# Patient Record
Sex: Male | Born: 1940 | Race: White | Hispanic: No | Marital: Married | Smoking: Current every day smoker
Health system: Southern US, Community
[De-identification: ages and names within clinical notes are randomized; demographics above are authoritative.]

## PROBLEM LIST (undated history)

## (undated) DIAGNOSIS — I739 Peripheral vascular disease, unspecified: Secondary | ICD-10-CM

## (undated) DIAGNOSIS — I10 Essential (primary) hypertension: Secondary | ICD-10-CM

## (undated) HISTORY — PX: CAROTID ENDARTERECTOMY: SUR193

## (undated) HISTORY — PX: FRACTURE SURGERY: SHX138

---

## 2009-07-08 ENCOUNTER — Emergency Department (HOSPITAL_COMMUNITY): Admission: EM | Admit: 2009-07-08 | Discharge: 2009-07-08 | Payer: Self-pay | Admitting: Emergency Medicine
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
## 2010-09-14 LAB — BASIC METABOLIC PANEL
BUN: 11 mg/dL (ref 6–23)
Calcium: 9 mg/dL (ref 8.4–10.5)
Chloride: 105 mEq/L (ref 96–112)
Creatinine, Ser: 0.91 mg/dL (ref 0.4–1.5)
GFR calc Af Amer: >60 mL/min (ref >60)
GFR calc non Af Amer: >60 mL/min (ref >60)
Glucose, Bld: 106 mg/dL — ABNORMAL HIGH (ref 70–99)
Potassium: 4.5 mEq/L (ref 3.5–5.1)

## 2010-09-14 LAB — CBC
Platelets: 168 10*3/uL (ref 150–400)
WBC: 15.3 10*3/uL — ABNORMAL HIGH (ref 4.0–10.5)

## 2011-07-10 ENCOUNTER — Other Ambulatory Visit: Payer: Self-pay | Admitting: Orthopedic Surgery

## 2011-07-13 ENCOUNTER — Encounter (HOSPITAL_BASED_OUTPATIENT_CLINIC_OR_DEPARTMENT_OTHER): Payer: Self-pay | Admitting: *Deleted

## 2011-07-13 NOTE — Progress Notes (Signed)
To come in for ekg,bmet  

## 2011-07-14 ENCOUNTER — Other Ambulatory Visit: Payer: Self-pay

## 2011-07-14 ENCOUNTER — Encounter (HOSPITAL_BASED_OUTPATIENT_CLINIC_OR_DEPARTMENT_OTHER)
Admission: RE | Admit: 2011-07-14 | Discharge: 2011-07-14 | Disposition: A | Discharge status: Home / Self Care | Payer: Medicare Other | Source: Ambulatory Visit | Attending: Orthopedic Surgery | Admitting: Orthopedic Surgery

## 2011-07-14 LAB — BASIC METABOLIC PANEL
BUN: 15 mg/dL (ref 6–23)
CO2: 28 mEq/L (ref 19–32)
Chloride: 101 mEq/L (ref 96–112)
GFR calc Af Amer: >90 mL/min (ref >90)
Glucose, Bld: 90 mg/dL (ref 70–99)
Potassium: 4.6 mEq/L (ref 3.5–5.1)

## 2011-07-15 ENCOUNTER — Ambulatory Visit (HOSPITAL_BASED_OUTPATIENT_CLINIC_OR_DEPARTMENT_OTHER): Payer: Medicare Other | Admitting: Anesthesiology

## 2011-07-15 ENCOUNTER — Encounter (HOSPITAL_BASED_OUTPATIENT_CLINIC_OR_DEPARTMENT_OTHER): Payer: Self-pay | Admitting: Anesthesiology

## 2011-07-15 ENCOUNTER — Ambulatory Visit (HOSPITAL_BASED_OUTPATIENT_CLINIC_OR_DEPARTMENT_OTHER)
Admission: RE | Admit: 2011-07-15 | Discharge: 2011-07-15 | Disposition: A | Discharge status: Home / Self Care | Payer: Medicare Other | Source: Ambulatory Visit | Attending: Orthopedic Surgery | Admitting: Orthopedic Surgery

## 2011-07-15 ENCOUNTER — Encounter (HOSPITAL_BASED_OUTPATIENT_CLINIC_OR_DEPARTMENT_OTHER)
Admission: RE | Disposition: A | Discharge status: Home / Self Care | Payer: Self-pay | Source: Ambulatory Visit | Attending: Orthopedic Surgery

## 2011-07-15 ENCOUNTER — Encounter (HOSPITAL_BASED_OUTPATIENT_CLINIC_OR_DEPARTMENT_OTHER): Payer: Self-pay | Admitting: Orthopedic Surgery

## 2011-07-15 DIAGNOSIS — Z01810 Encounter for preprocedural cardiovascular examination: Secondary | ICD-10-CM | POA: Insufficient documentation

## 2011-07-15 DIAGNOSIS — Z01812 Encounter for preprocedural laboratory examination: Secondary | ICD-10-CM | POA: Insufficient documentation

## 2011-07-15 DIAGNOSIS — IMO0002 Reserved for concepts with insufficient information to code with codable children: Secondary | ICD-10-CM | POA: Insufficient documentation

## 2011-07-15 DIAGNOSIS — I10 Essential (primary) hypertension: Secondary | ICD-10-CM | POA: Insufficient documentation

## 2011-07-15 DIAGNOSIS — X58XXXS Exposure to other specified factors, sequela: Secondary | ICD-10-CM | POA: Insufficient documentation

## 2011-07-15 HISTORY — DX: Essential (primary) hypertension: I10

## 2011-07-15 HISTORY — DX: Peripheral vascular disease, unspecified: I73.9

## 2011-07-15 HISTORY — PX: AMPUTATION: SHX166

## 2011-07-15 LAB — ANAEROBIC CULTURE: Gram Stain: NONE SEEN

## 2011-07-15 LAB — WOUND CULTURE

## 2011-07-15 SURGERY — AMPUTATION DIGIT
Anesthesia: General | Site: Finger | Laterality: Left | Wound class: Dirty or Infected

## 2011-07-15 MED ORDER — METOCLOPRAMIDE HCL 5 MG/ML IJ SOLN: 10.0000 mg | Freq: Once | INTRAMUSCULAR | Status: DC | PRN

## 2011-07-15 MED ORDER — CEFAZOLIN SODIUM 1-5 GM-% IV SOLN: 1.0000 g | INTRAVENOUS | Status: DC

## 2011-07-15 MED ORDER — DEXAMETHASONE SODIUM PHOSPHATE 10 MG/ML IJ SOLN
INTRAMUSCULAR | Status: DC | PRN
  Administered 2011-07-15: 10 mg via INTRAVENOUS

## 2011-07-15 MED ORDER — CEPHALEXIN 250 MG PO CAPS: 250.0000 mg | ORAL_CAPSULE | Freq: Four times a day (QID) | ORAL | Status: AC

## 2011-07-15 MED ORDER — MIDAZOLAM HCL 2 MG/2ML IJ SOLN
0.5000 mg | INTRAMUSCULAR | Status: DC | PRN
  Administered 2011-07-15: 2 mg via INTRAVENOUS

## 2011-07-15 MED ORDER — LIDOCAINE HCL (CARDIAC) 20 MG/ML IV SOLN
INTRAVENOUS | Status: DC | PRN
  Administered 2011-07-15: 40 mg via INTRAVENOUS

## 2011-07-15 MED ORDER — CHLORHEXIDINE GLUCONATE 4 % EX LIQD: 60.0000 mL | Freq: Once | CUTANEOUS | Status: DC

## 2011-07-15 MED ORDER — CEFAZOLIN SODIUM 1-5 GM-% IV SOLN
1.0000 g | INTRAVENOUS | Status: AC
  Administered 2011-07-15: 1 g via INTRAVENOUS

## 2011-07-15 MED ORDER — LACTATED RINGERS IV SOLN
INTRAVENOUS | Status: DC
  Administered 2011-07-15 (×2): via INTRAVENOUS

## 2011-07-15 MED ORDER — HYDROCODONE-ACETAMINOPHEN 5-500 MG PO TABS: 1.0000 | ORAL_TABLET | ORAL | Status: AC | PRN

## 2011-07-15 MED ORDER — 0.9 % SODIUM CHLORIDE (POUR BTL) OPTIME
TOPICAL | Status: DC | PRN
  Administered 2011-07-15: 200 mL

## 2011-07-15 MED ORDER — LIDOCAINE HCL 1 % IJ SOLN
INTRAMUSCULAR | Status: DC | PRN
  Administered 2011-07-15: 2 mL via INTRADERMAL

## 2011-07-15 MED ORDER — EPHEDRINE SULFATE 50 MG/ML IJ SOLN
INTRAMUSCULAR | Status: DC | PRN
  Administered 2011-07-15: 10 mg via INTRAVENOUS

## 2011-07-15 MED ORDER — PROPOFOL 10 MG/ML IV EMUL
INTRAVENOUS | Status: DC | PRN
  Administered 2011-07-15: 200 mg via INTRAVENOUS

## 2011-07-15 MED ORDER — ROPIVACAINE HCL 5 MG/ML IJ SOLN
INTRAMUSCULAR | Status: DC | PRN
  Administered 2011-07-15: 30 mL via EPIDURAL

## 2011-07-15 MED ORDER — ONDANSETRON HCL 4 MG/2ML IJ SOLN
INTRAMUSCULAR | Status: DC | PRN
  Administered 2011-07-15: 4 mg via INTRAVENOUS

## 2011-07-15 MED ORDER — FENTANYL CITRATE 0.05 MG/ML IJ SOLN: 25.0000 ug | INTRAMUSCULAR | Status: DC | PRN

## 2011-07-15 MED ORDER — FENTANYL CITRATE 0.05 MG/ML IJ SOLN
50.0000 ug | INTRAMUSCULAR | Status: DC | PRN
  Administered 2011-07-15: 50 ug via INTRAVENOUS

## 2011-07-15 MED ORDER — MORPHINE SULFATE 2 MG/ML IJ SOLN: 0.0500 mg/kg | INTRAMUSCULAR | Status: DC | PRN

## 2011-07-15 SURGICAL SUPPLY — 48 items
BLADE MINI RND TIP GREEN BEAV (BLADE) IMPLANT
BLADE OSC/SAG .038X5.5 CUT EDG (BLADE) IMPLANT
BLADE SURG 15 STRL LF DISP TIS (BLADE) ×1 IMPLANT
BLADE SURG 15 STRL SS (BLADE) ×1
BNDG COHESIVE 1X5 TAN STRL LF (GAUZE/BANDAGES/DRESSINGS) ×2 IMPLANT
BNDG ESMARK 4X9 LF (GAUZE/BANDAGES/DRESSINGS) IMPLANT
CHLORAPREP W/TINT 26ML (MISCELLANEOUS) ×2 IMPLANT
CLOTH BEACON ORANGE TIMEOUT ST (SAFETY) ×2 IMPLANT
CORDS BIPOLAR (ELECTRODE) ×2 IMPLANT
COVER MAYO STAND STRL (DRAPES) ×2 IMPLANT
COVER TABLE BACK 60X90 (DRAPES) ×2 IMPLANT
CUFF TOURNIQUET SINGLE 18IN (TOURNIQUET CUFF) IMPLANT
DECANTER SPIKE VIAL GLASS SM (MISCELLANEOUS) IMPLANT
DRAIN PENROSE 1/4X12 LTX STRL (WOUND CARE) IMPLANT
DRAPE EXTREMITY T 121X128X90 (DRAPE) ×2 IMPLANT
DRAPE OEC MINIVIEW 54X84 (DRAPES) IMPLANT
DRAPE SURG 17X23 STRL (DRAPES) ×2 IMPLANT
DRSG XEROFORM 1X8 (GAUZE/BANDAGES/DRESSINGS) ×2 IMPLANT
GAUZE XEROFORM 1X8 LF (GAUZE/BANDAGES/DRESSINGS) ×2 IMPLANT
GLOVE BIO SURGEON STRL SZ 6.5 (GLOVE) ×2 IMPLANT
GLOVE BIOGEL PI IND STRL 8.5 (GLOVE) ×1 IMPLANT
GLOVE BIOGEL PI INDICATOR 8.5 (GLOVE) ×1
GLOVE SURG ORTHO 8.0 STRL STRW (GLOVE) ×2 IMPLANT
GOWN BRE IMP PREV XXLGXLNG (GOWN DISPOSABLE) ×2 IMPLANT
GOWN PREVENTION PLUS XLARGE (GOWN DISPOSABLE) ×2 IMPLANT
NS IRRIG 1000ML POUR BTL (IV SOLUTION) ×2 IMPLANT
PACK BASIN DAY SURGERY FS (CUSTOM PROCEDURE TRAY) ×2 IMPLANT
PADDING CAST ABS 4INX4YD NS (CAST SUPPLIES) ×1
PADDING CAST ABS COTTON 4X4 ST (CAST SUPPLIES) ×1 IMPLANT
SPLINT FINGER 5/8X3.25 (SOFTGOODS) ×1 IMPLANT
SPLINT FINGER FOAM 3 9119 05 (SOFTGOODS) ×2
SPLINT FNGR BALL END 5/8X4.25 (SOFTGOODS) IMPLANT
SPLINT PLASTALUME BALL 4 1/4IN (SOFTGOODS)
SPONGE GAUZE 4X4 12PLY (GAUZE/BANDAGES/DRESSINGS) ×2 IMPLANT
STOCKINETTE 4X48 STRL (DRAPES) ×2 IMPLANT
SUT CHROMIC 4 0 P 3 18 (SUTURE) IMPLANT
SUT MERSILENE 4 0 P 3 (SUTURE) IMPLANT
SUT VIC AB 4-0 P-3 18XBRD (SUTURE) IMPLANT
SUT VIC AB 4-0 P3 18 (SUTURE)
SUT VICRYL RAPID 5 0 P 3 (SUTURE) IMPLANT
SUT VICRYL RAPIDE 4/0 PS 2 (SUTURE) ×2 IMPLANT
SWAB CULTURE LIQ STUART DBL (MISCELLANEOUS) ×2 IMPLANT
SYR BULB 3OZ (MISCELLANEOUS) ×2 IMPLANT
SYR CONTROL 10ML LL (SYRINGE) IMPLANT
TOWEL OR 17X24 6PK STRL BLUE (TOWEL DISPOSABLE) ×2 IMPLANT
TUBE ANAEROBIC SPECIMEN COL (MISCELLANEOUS) ×2 IMPLANT
UNDERPAD 30X30 INCONTINENT (UNDERPADS AND DIAPERS) ×2 IMPLANT
WATER STERILE IRR 1000ML POUR (IV SOLUTION) ×2 IMPLANT

## 2011-07-15 NOTE — H&P (Signed)
Hector Mcdowell is a 71 year old right hand dominant male who comes in complaining of having smashed his left ring finger in a wood splitter on 05-24-11. He did not seek medical attention at the time. He has been treating this himself. He simply cut off the part that was dangling. He is complaining of pain whenever he touches the tip of the finger. He has had no treatment otherwise.  Past Medical History: He has no allergies. He is on Lisinopril and Atenolol. He has had stents in his neck.  Family Medical History: Negative.  Social History: He smokes one half PPD and is advised to quit and the reasons for this. He does not drink.  Review of Systems: Positive for high BP, otherwise negative for 14 points 07/15/2011  Hector Mcdowell is an 71 y.o. male.   Chief Complaint: partial amputation LRF HPI: see above  Past Medical History  Diagnosis Date  . Hypertension   . PVD (peripheral vascular disease)     Past Surgical History  Procedure Date  . Carotid endarterectomy     both sides  . Fracture surgery     fx jaw as a young man-no surg    History reviewed. No pertinent family history. Social History:  reports that he has been smoking.  He does not have any smokeless tobacco history on file. He reports that he drinks alcohol. He reports that he does not use illicit drugs.  Allergies: No Known Allergies  No current facility-administered medications on file as of 07/15/2011.   Medications Prior to Admission  Medication Sig Dispense Refill  . atenolol (TENORMIN) 25 MG tablet Take 25 mg by mouth daily.      Marland Kitchen lisinopril (PRINIVIL,ZESTRIL) 20 MG tablet Take 20 mg by mouth daily.        Results for orders placed during the hospital encounter of 07/15/11 (from the past 48 hour(s))  BASIC METABOLIC PANEL     Status: Abnormal   Collection Time   07/14/11 11:30 AM      Component Value Range Comment   Sodium 138  135 - 145 (mEq/L)    Potassium 4.6  3.5 - 5.1 (mEq/L)    Chloride 101  96 -  112 (mEq/L)    CO2 28  19 - 32 (mEq/L)    Glucose, Bld 90  70 - 99 (mg/dL)    BUN 15  6 - 23 (mg/dL)    Creatinine, Ser 1.61  0.50 - 1.35 (mg/dL)    Calcium 9.5  8.4 - 10.5 (mg/dL)    GFR calc non Af Amer 81 (*) >90 (mL/min)    GFR calc Af Amer >90  >90 (mL/min)     No results found.   Pertinent items are noted in HPI.  Height 5\' 11"  (1.803 m), weight 72.576 kg (160 lb).  General appearance: alert, cooperative and appears stated age Head: Normocephalic, without obvious abnormality Neck: no adenopathy Resp: clear to auscultation bilaterally Cardio: regular rate and rhythm, S1, S2 normal, no murmur, click, rub or gallop GI: soft, non-tender; bowel sounds normal; no masses,  no organomegaly Extremities: extremities normal, atraumatic, no cyanosis or edema Pulses: 2+ and symmetric Skin: Skin color, texture, turgor normal. No rashes or lesions Neurologic: Grossly normal Incision/Wound: Granulating   Assessment/Plan We would recommend revision of this. He is advised this may be at the level of his DIP joint.  The pre, peri and post op course are discussed along with risks and complications. They are in agreement with this.  Hector Mcdowell R 07/15/2011, 10:32 AM                Hector Mcdowell is a 71 y.o. male patient.  No diagnosis found. Past Medical History  Diagnosis Date  . Hypertension   . PVD (peripheral vascular disease)    No past surgical history pertinent negatives on file. Scheduled Meds:   Continuous Infusions:   PRN Meds:    No Known Allergies Active Problems:  * No active hospital problems. *   Height 5\' 11"  (1.803 m), weight 72.576 kg (160 lb).  @IPPOSUB @ @IPPOOBJ @ @IPPOAP @  Hector Mcdowell R 07/15/2011

## 2011-07-15 NOTE — Transfer of Care (Signed)
Immediate Anesthesia Transfer of Care Note  Patient: Hector Mcdowell  Procedure(s) Performed:  AMPUTATION DIGIT - revision amputation left ring finger  Patient Location: PACU  Anesthesia Type: General  Level of Consciousness: sedated  Airway & Oxygen Therapy: Patient Spontanous Breathing and Patient connected to face mask oxygen  Post-op Assessment: Report given to PACU RN and Post -op Vital signs reviewed and stable  Post vital signs: Reviewed and stable Filed Vitals:   07/15/11 1200  BP: 151/68  Pulse: 61  Temp:   Resp: 19    Complications: No apparent anesthesia complications

## 2011-07-15 NOTE — Anesthesia Postprocedure Evaluation (Signed)
Anesthesia Post Note  Patient: Hector Mcdowell  Procedure(s) Performed:  AMPUTATION DIGIT - revision amputation left ring finger  Anesthesia type: General  Patient location: PACU  Post pain: Pain level controlled  Post assessment: Patient's Cardiovascular Status Stable  Last Vitals:  Filed Vitals:   07/15/11 1345  BP: 146/66  Pulse: 66  Temp:   Resp: 18    Post vital signs: Reviewed and stable  Level of consciousness: alert  Complications: No apparent anesthesia complications

## 2011-07-15 NOTE — Progress Notes (Signed)
Assisted Dr. Frederick with left, ultrasound guided, axillary block. Side rails up, monitors on throughout procedure. See vital signs in flow sheet. Tolerated Procedure well. 

## 2011-07-15 NOTE — Anesthesia Preprocedure Evaluation (Signed)
Anesthesia Evaluation  Patient identified by MRN, date of birth, ID band Patient awake    Reviewed: Allergy & Precautions, H&P , NPO status , Patient's Chart, lab work & pertinent test results, reviewed documented beta blocker date and time   Airway Mallampati: II TM Distance: >3 FB Neck ROM: full    Dental   Pulmonary neg pulmonary ROS,          Cardiovascular hypertension, On Medications and On Home Beta Blockers     Neuro/Psych Negative Neurological ROS  Negative Psych ROS   GI/Hepatic negative GI ROS, Neg liver ROS,   Endo/Other  Negative Endocrine ROS  Renal/GU negative Renal ROS  Genitourinary negative   Musculoskeletal   Abdominal   Peds  Hematology negative hematology ROS (+)   Anesthesia Other Findings See surgeon's H&P   Reproductive/Obstetrics negative OB ROS                           Anesthesia Physical Anesthesia Plan  ASA: III  Anesthesia Plan: General   Post-op Pain Management: MAC Combined w/ Regional for Post-op pain   Induction: Intravenous  Airway Management Planned: LMA  Additional Equipment:   Intra-op Plan:   Post-operative Plan: Extubation in OR  Informed Consent: I have reviewed the patients History and Physical, chart, labs and discussed the procedure including the risks, benefits and alternatives for the proposed anesthesia with the patient or authorized representative who has indicated his/her understanding and acceptance.     Plan Discussed with: CRNA and Surgeon  Anesthesia Plan Comments:         Anesthesia Quick Evaluation

## 2011-07-15 NOTE — Op Note (Signed)
Dictated number: [id number]

## 2011-07-15 NOTE — Anesthesia Procedure Notes (Addendum)
Anesthesia Regional Block:   Narrative:    Anesthesia Regional Block:  Axillary brachial plexus block  Pre-Anesthetic Checklist: ,, timeout performed, Correct Patient, Correct Site, Correct Laterality, Correct Procedure, Correct Position, site marked, Risks and benefits discussed,  Surgical consent,  Pre-op evaluation,  At surgeon's request and post-op pain management  Laterality: Left  Prep: chloraprep       Needles:   Needle Type: Other   (Arrow Echogenic)   Needle Length: 9cm  Needle Gauge: 21    Additional Needles:  Procedures: ultrasound guided Axillary brachial plexus block Narrative:  Start time: 07/15/2011 11:48 AM End time: 07/15/2011 11:57 AM Injection made incrementally with aspirations every 5 mL.  Performed by: Personally  Anesthesiologist: C Frederick  Additional Notes: Ultrasound guidance used to: id relevant anatomy, confirm needle position, local anesthetic spread, avoidance of vascular puncture. Picture saved. No complications. Block performed personally by Janetta Hora. Gelene Mink, MD    Supraclavicular block Procedure Name: LMA Insertion Date/Time: 07/15/2011 12:34 PM Performed by: Signa Kell Pre-anesthesia Checklist: Patient identified, Emergency Drugs available, Suction available and Patient being monitored Patient Re-evaluated:Patient Re-evaluated prior to inductionOxygen Delivery Method: Circle System Utilized Preoxygenation: Pre-oxygenation with 100% oxygen Intubation Type: IV induction Ventilation: Mask ventilation without difficulty LMA: LMA inserted LMA Size: 4.0 Number of attempts: 1 Airway Equipment and Method: bite block Placement Confirmation: positive ETCO2 Tube secured with: Tape Dental Injury: Teeth and Oropharynx as per pre-operative assessment

## 2011-07-15 NOTE — Brief Op Note (Signed)
07/15/2011  1:10 PM  PATIENT:  Hector Mcdowell  71 y.o. male  PRE-OPERATIVE DIAGNOSIS:  amputation left ring finger  POST-OPERATIVE DIAGNOSIS:  amputation left ring finger  PROCEDURE:  Procedure(s): AMPUTATION DIGIT  SURGEON:  Surgeon(s): Nicki Reaper, MD  PHYSICIAN ASSISTANT:   ASSISTANTS: none   ANESTHESIA:   regional and general  EBL:  Total I/O In: 1000 [I.V.:1000] Out: -   BLOOD ADMINISTERED:none  DRAINS: none   LOCAL MEDICATIONS USED:  NONE  SPECIMEN:  No Specimen  DISPOSITION OF SPECIMEN:  N/A  COUNTS:  YES  TOURNIQUET:   Total Tourniquet Time Documented: Forearm (Left) - 23 minutes  DICTATION: .Other Dictation: Dictation Number [id number]  PLAN OF CARE: Discharge to home after PACU  PATIENT DISPOSITION:  PACU - hemodynamically stable.

## 2011-07-16 ENCOUNTER — Encounter (HOSPITAL_BASED_OUTPATIENT_CLINIC_OR_DEPARTMENT_OTHER): Payer: Self-pay | Admitting: Orthopedic Surgery

## 2011-07-16 NOTE — Op Note (Signed)
NAMEJOHNTE, PORTNOY NO.:  [id number]  MEDICAL RECORD NO.:  [id number]  LOCATION:                                 FACILITY:  PHYSICIAN:  Cindee Salt, M.D.            DATE OF BIRTH:  DATE OF PROCEDURE:  07/15/2011 DATE OF DISCHARGE:                              OPERATIVE REPORT   PREOPERATIVE DIAGNOSIS:  Partial amputation, left ring finger.  POSTOPERATIVE DIAGNOSIS:  Partial amputation, left ring finger.  OPERATION:  Revision amputation, left ring finger.  SURGEON:  Cindee Salt, MD  ANESTHESIA:  Supraclavicular block, general.  ANESTHESIOLOGIST:  Janetta Hora. Gelene Mink, MD  HISTORY:  The patient is a 71 year old male who suffered a crush amputation to the tip of his left ring finger in a log splitter on May 24, 2011, has not sought medical attention until last Friday. He was seen with a partial amputation, having stated that he removed the piece that was dangling, has had no further treatment to it.  He is admitted now for revision of the amputation.  Pre, peri, and postoperative course have been discussed along with risks and complications with the possibility of nail problems, the possibility of having further revisions.  In the preoperative area, the patient is seen.  The extremity marked by both the patient and surgeon.  Antibiotic given.  PROCEDURE:  The patient was brought to the operating room where a supraclavicular block was carried out by Dr. Gelene Mink and general anesthetic given.  He was prepped using ChloraPrep, supine position, left arm-free.  A 3-minute dry time was allowed.  Time-out taken, confirming the patient and procedure.  The limb was exsanguinated with an Esmarch bandage.  Tourniquet placed on the forearm was inflated to 250 mmHg.  The eschar was removed from the tip of the finger revealing exposed bones from the prior partial amputation.  Cultures were taken for both aerobic and anaerobic cultures.  The nail plate  appeared present proximally.  The bone was then rongeured back.  This was undermined.  Necrotic tissue was removed along with early granulation tissue.  A V was then performed proximally allowing the periosteum to be brought over the bony fragment after debridement of the distal portion. This was sutured into position after copiously irrigating it with interrupted 5-0 Vicryl Rapide sutures.  The skin was able to be rotated from both sides underneath the nail plate to support from both radial and ulnar aspect.  This allowed closure of the pericentral area which was left open.  Closure was performed with interrupted 5-0 Vicryl Rapide sutures.  A sterile compressive dressing and splint was applied.  On deflation of the tourniquet, remaining fingers turned pink.  He was taken to the recovery room for observation in satisfactory condition.          ______________________________ Cindee Salt, M.D.     GK/MEDQ  D:  07/15/2011  T:  07/16/2011  Job:  [id number]

## 2017-08-31 ENCOUNTER — Ambulatory Visit (INDEPENDENT_AMBULATORY_CARE_PROVIDER_SITE_OTHER): Payer: Self-pay | Admitting: Orthopaedic Surgery

## 2017-09-02 ENCOUNTER — Ambulatory Visit (INDEPENDENT_AMBULATORY_CARE_PROVIDER_SITE_OTHER): Payer: Self-pay

## 2017-09-02 ENCOUNTER — Encounter (INDEPENDENT_AMBULATORY_CARE_PROVIDER_SITE_OTHER): Payer: Self-pay | Admitting: Orthopaedic Surgery

## 2017-09-02 ENCOUNTER — Ambulatory Visit (INDEPENDENT_AMBULATORY_CARE_PROVIDER_SITE_OTHER): Payer: Medicare Other | Admitting: Orthopaedic Surgery

## 2017-09-02 DIAGNOSIS — M7062 Trochanteric bursitis, left hip: Secondary | ICD-10-CM | POA: Diagnosis not present

## 2017-09-02 DIAGNOSIS — M7061 Trochanteric bursitis, right hip: Secondary | ICD-10-CM

## 2017-09-02 DIAGNOSIS — M25551 Pain in right hip: Secondary | ICD-10-CM | POA: Diagnosis not present

## 2017-09-02 DIAGNOSIS — M25552 Pain in left hip: Secondary | ICD-10-CM

## 2017-09-02 NOTE — Progress Notes (Signed)
Office Visit Note   Patient: Hector Mcdowell           Date of Birth: 1940/12/25           MRN: [id number] Visit Date: 09/02/2017              Requested by: No referring provider defined for this encounter. PCP: Janece CanterburyBoals, Aaron, MD   Assessment & Plan: Visit Diagnoses:  1. Bilateral hip pain   2. Trochanteric bursitis, left hip   3. Trochanteric bursitis, right hip     Plan: He understands I am recommending trochanteric injections to see if this will help calm down his symptoms.  My next step would be having him set up for intra-articular injections if this does not help.  All questions concerns were answered and addressed.  I did talk about the risk and benefits of steroid injections as well and he tolerated these well.  Follow-Up Instructions: Return in about 4 weeks (around 09/30/2017).   Orders:  Orders Placed This Encounter  Procedures  . Large Joint Inj  . XR HIPS BILAT W OR W/O PELVIS 2V   No orders of the defined types were placed in this encounter.     Procedures: Large Joint Inj: bilateral greater trochanter on 09/02/2017 3:56 PM Indications: pain and diagnostic evaluation Details: 22 G 1.5 in needle, lateral approach  Arthrogram: No  Outcome: tolerated well, no immediate complications Procedure, treatment alternatives, risks and benefits explained, specific risks discussed. Consent was given by the patient. Immediately prior to procedure a time out was called to verify the correct patient, procedure, equipment, support staff and site/side marked as required. Patient was prepped and draped in the usual sterile fashion.       Clinical Data: No additional findings.   Subjective: Chief Complaint  Patient presents with  . Right Hip - Follow-up  . Left Hip - Follow-up  The patient comes in with a 25-year history of worsening hip pain where he cannot walk any distance.  Denies any groin pain.  He feels like there are times of the hips lock up on him per his  description.  He points to really the top of trochanteric area and iliac crest area as a source of his pain.  Denies any back pain denies any weakness in his legs or numbness and tingling in his feet.  He can cross his legs easily states.  He sitting putting shoes and socks on easily.  HPI  Review of Systems He currently denies any headache, chest pain, shortness of breath, fever, chills, nausea, vomiting.  Objective: Vital Signs: There were no vitals taken for this visit.  Physical Exam He is alert and oriented x3 and in no acute distress Ortho Exam Examination of both hips show fluid and full range of motion with no pain in the groin and minimal pain of the trochanteric area on both hips just at the top.  He has no pain over the IT band and his knee exam is normal bilaterally his back exam is normal. Specialty Comments:  No specialty comments available.  Imaging: Xr Hips Bilat W Or W/o Pelvis 2v  Result Date: 09/02/2017 AP pelvis and a lateral both hips show well-maintained hip joints.  There is no significant arthritic findings of either hip.  There is no acute findings around the trochanteric or pelvic areas of either hip.  They are basically normal.    PMFS History: Patient Active Problem List   Diagnosis Date Noted  . Bilateral  hip pain 09/02/2017  . Trochanteric bursitis, left hip 09/02/2017  . Trochanteric bursitis, right hip 09/02/2017   Past Medical History:  Diagnosis Date  . Hypertension   . PVD (peripheral vascular disease) (HCC)     History reviewed. No pertinent family history.  Past Surgical History:  Procedure Laterality Date  . AMPUTATION  07/15/2011   Procedure: AMPUTATION DIGIT;  Surgeon: Nicki Reaper, MD;  Location: [facility];  Service: Orthopedics;  Laterality: Left;  revision amputation left ring finger  . CAROTID ENDARTERECTOMY     both sides  . FRACTURE SURGERY     fx jaw as a young man-no surg   Social History   Occupational  History  . Not on file  Tobacco Use  . Smoking status: Current Every Day Smoker    Packs/day: 0.50  Substance and Sexual Activity  . Alcohol use: Yes    Comment: occ  . Drug use: No  . Sexual activity: Not on file

## 2017-09-30 ENCOUNTER — Ambulatory Visit (INDEPENDENT_AMBULATORY_CARE_PROVIDER_SITE_OTHER): Payer: Medicare Other | Admitting: Orthopaedic Surgery

## 2017-09-30 ENCOUNTER — Encounter (INDEPENDENT_AMBULATORY_CARE_PROVIDER_SITE_OTHER): Payer: Self-pay | Admitting: Orthopaedic Surgery

## 2017-09-30 DIAGNOSIS — M7061 Trochanteric bursitis, right hip: Secondary | ICD-10-CM

## 2017-09-30 DIAGNOSIS — M7062 Trochanteric bursitis, left hip: Secondary | ICD-10-CM

## 2017-09-30 NOTE — Progress Notes (Signed)
The patient is being seen in follow-up.  We provided trochanteric injections on both his hips at his last visit.  He says he really did not have any hip pain at all is more like weakness in his legs like legs or give out on him.  He denies any numbness and tingling but he feels like he is weak in general.  On exam he has fluid range of motion of both hips both knees both feet and both ankles.  He has 5 out of 5 strength in all muscle groups and normal sensation in his feet and legs today.  Given his normal examination today and not sure what else I can offer other than formal physical therapy to strengthen his back and lower extremities in general.  He is having no neurologic symptoms at all so certainly this could still be stenosis of the lumbar spine but today's exam is normal.  All questions concerns were answered and addressed  Follow-up as needed otherwise.

## 2018-07-12 ENCOUNTER — Ambulatory Visit (INDEPENDENT_AMBULATORY_CARE_PROVIDER_SITE_OTHER): Payer: Medicare Other

## 2018-07-12 ENCOUNTER — Ambulatory Visit (INDEPENDENT_AMBULATORY_CARE_PROVIDER_SITE_OTHER): Payer: Medicare Other | Admitting: Orthopaedic Surgery

## 2018-07-12 ENCOUNTER — Encounter (INDEPENDENT_AMBULATORY_CARE_PROVIDER_SITE_OTHER): Payer: Self-pay | Admitting: Orthopaedic Surgery

## 2018-07-12 DIAGNOSIS — M25511 Pain in right shoulder: Secondary | ICD-10-CM | POA: Diagnosis not present

## 2018-07-12 MED ORDER — METHYLPREDNISOLONE ACETATE 40 MG/ML IJ SUSP
40.0000 mg | INTRAMUSCULAR | Status: AC | PRN
Start: 2018-07-12 — End: 2018-07-12
  Administered 2018-07-12: 40 mg via INTRA_ARTICULAR

## 2018-07-12 MED ORDER — LIDOCAINE HCL 1 % IJ SOLN
3.0000 mL | INTRAMUSCULAR | Status: AC | PRN
  Administered 2018-07-12: 3 mL

## 2018-07-12 NOTE — Progress Notes (Signed)
Office Visit Note   Patient: Hector Mcdowell           Date of Birth: [birth year]           MRN: [id number] Visit Date: 07/12/2018              Requested by: Hector Canterbury, MD [address] [place], [place] [address] PCP: Hector Canterbury, MD   Assessment & Plan: Visit Diagnoses:  1. Acute pain of right shoulder     Plan: He shown shoulder exercises that he will perform at home.  We will see him back in 2 weeks to check his response to the injection in his right shoulder.  If his pain persist may consider MRI or possibly formal physical therapy.  Follow-Up Instructions: Return in about 2 weeks (around 07/26/2018).   Orders:  Orders Placed This Encounter  Procedures  . Large Joint Inj: R subacromial bursa  . XR Shoulder Right   No orders of the defined types were placed in this encounter.     Procedures: Large Joint Inj: R subacromial bursa on 07/12/2018 10:22 AM Indications: pain Details: 22 G 1.5 in needle, superior approach  Arthrogram: No  Medications: 3 mL lidocaine 1 %; 40 mg methylPREDNISolone acetate 40 MG/ML Outcome: tolerated well, no immediate complications Procedure, treatment alternatives, risks and benefits explained, specific risks discussed. Consent was given by the patient. Immediately prior to procedure a time out was called to verify the correct patient, procedure, equipment, support staff and site/side marked as required. Patient was prepped and draped in the usual sterile fashion.       Clinical Data: No additional findings.   Subjective: Chief Complaint  Patient presents with  . Right Shoulder - Pain    HPI Hector Mcdowell comes in today for right shoulder pain for the last 2 to 3 weeks no known injury.  Denies any radicular symptoms down the right arm.  Shoulders painful with overhead activity.  Denies any neck pain.  He notes decreased range of motion due to the pain. Review of Systems No fevers chills shortness of breath chest pain please see HPI  otherwise negative  Objective: Vital Signs: There were no vitals taken for this visit.  Physical Exam Constitutional:      Appearance: He is normal weight. He is not ill-appearing or diaphoretic.  Pulmonary:     Effort: Pulmonary effort is normal.  Neurological:     Mental Status: He is alert and oriented to person, place, and time.  Psychiatric:        Mood and Affect: Mood normal.     Ortho Exam 5 out of 5 strength with external and internal rotation against resistance bilateral shoulders.  Negative liftoff test bilaterally.  Negative empty can test bilaterally.  Positive impingement testing on the right negative on the left.  Right shoulder girdle no rashes skin lesions ulcerations erythema or ecchymosis. Specialty Comments:  No specialty comments available.  Imaging: Xr Shoulder Right  Result Date: 07/12/2018 Right shoulder 3 views: No acute fracture.  Shoulders well located.  Glenohumeral joint is well-maintained.  Calcific changes of the rotator cuff are noted.    PMFS History: Patient Active Problem List   Diagnosis Date Noted  . Bilateral hip pain 09/02/2017  . Trochanteric bursitis, left hip 09/02/2017  . Trochanteric bursitis, right hip 09/02/2017   Past Medical History:  Diagnosis Date  . Hypertension   . PVD (peripheral vascular disease) (HCC)     No family history on file.  Past Surgical History:  Procedure Laterality Date  . AMPUTATION  07/15/2011   Procedure: AMPUTATION DIGIT;  Surgeon: Nicki ReaperGary R Kuzma, MD;  Location: [facility];  Service: Orthopedics;  Laterality: Left;  revision amputation left ring finger  . CAROTID ENDARTERECTOMY     both sides  . FRACTURE SURGERY     fx jaw as a young man-no surg   Social History   Occupational History  . Not on file  Tobacco Use  . Smoking status: Current Every Day Smoker    Packs/day: 0.50  Substance and Sexual Activity  . Alcohol use: Yes    Comment: occ  . Drug use: No  . Sexual  activity: Not on file

## 2018-07-26 ENCOUNTER — Ambulatory Visit (INDEPENDENT_AMBULATORY_CARE_PROVIDER_SITE_OTHER): Payer: Medicare Other | Admitting: Orthopaedic Surgery

## 2018-07-26 ENCOUNTER — Encounter (INDEPENDENT_AMBULATORY_CARE_PROVIDER_SITE_OTHER): Payer: Self-pay | Admitting: Orthopaedic Surgery

## 2018-07-26 DIAGNOSIS — M25511 Pain in right shoulder: Secondary | ICD-10-CM | POA: Diagnosis not present

## 2018-07-26 MED ORDER — LIDOCAINE HCL 1 % IJ SOLN
5.0000 mL | INTRAMUSCULAR | Status: AC | PRN
  Administered 2018-07-26: 5 mL

## 2018-07-26 MED ORDER — METHYLPREDNISOLONE ACETATE 40 MG/ML IJ SUSP
40.0000 mg | INTRAMUSCULAR | Status: AC | PRN
Start: 2018-07-26 — End: 2018-07-26
  Administered 2018-07-26: 40 mg via INTRA_ARTICULAR

## 2018-07-26 NOTE — Progress Notes (Signed)
   Office Visit Note   Patient: Hector Mcdowell           Date of Birth: [birth year]           MRN: [id number] Visit Date: 07/26/2018 Requested by: Janece Canterbury, MD [id number] [place], [place] [address] PCP: Janece Canterbury, MD  Subjective: Chief Complaint  Patient presents with  . Right Shoulder - Follow-up    HPI: He is a 78 year old here for ultrasound-guided right glenohumeral injection.  Subacromial injection did not provide much relief.  X-rays show chondrocalcinosis.  He is never been diagnosed with gout or pseudogout, no other joints are bothering him right now.                ROS: Otherwise noncontributory  Objective: Vital Signs: There were no vitals taken for this visit.  Physical Exam:  Right shoulder: Limited overhead reach and behind the back reach compared to the left.  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  Assessment & Plan: 1.  Persistent right shoulder pain possibly due to pseudogout -Diagnostic/therapeutic glenohumeral injection performed today.  He had excellent pain relief during the anesthetic phase.  His range of motion improved as well.  He will follow-up with Dr. Magnus Ivan as discussed.   Follow-Up Instructions: No follow-ups on file.      Procedures: Large Joint Inj: R glenohumeral on 07/26/2018 10:16 AM Indications: pain Details: 22 G 3.5 in needle, ultrasound-guided posterior approach Medications: 5 mL lidocaine 1 %; 40 mg methylPREDNISolone acetate 40 MG/ML Consent was given by the patient.      No notes on file    PMFS History: Patient Active Problem List   Diagnosis Date Noted  . Bilateral hip pain 09/02/2017  . Trochanteric bursitis, left hip 09/02/2017  . Trochanteric bursitis, right hip 09/02/2017   Past Medical History:  Diagnosis Date  . Hypertension   . PVD (peripheral vascular disease) (HCC)     History reviewed. No pertinent family history.  Past Surgical History:  Procedure Laterality  Date  . AMPUTATION  07/15/2011   Procedure: AMPUTATION DIGIT;  Surgeon: Nicki Reaper, MD;  Location: [facility];  Service: Orthopedics;  Laterality: Left;  revision amputation left ring finger  . CAROTID ENDARTERECTOMY     both sides  . FRACTURE SURGERY     fx jaw as a young man-no surg   Social History   Occupational History  . Not on file  Tobacco Use  . Smoking status: Current Every Day Smoker    Packs/day: 0.50  Substance and Sexual Activity  . Alcohol use: Yes    Comment: occ  . Drug use: No  . Sexual activity: Not on file

## 2019-08-28 DEATH — deceased
